# Patient Record
Sex: Female | Born: 1985 | Race: Black or African American | Hispanic: No | Marital: Single | Smoking: Current every day smoker
Health system: Southern US, Community
[De-identification: ages and names within clinical notes are randomized; demographics above are authoritative.]

## PROBLEM LIST (undated history)

## (undated) DIAGNOSIS — L0591 Pilonidal cyst without abscess: Secondary | ICD-10-CM

## (undated) HISTORY — PX: WISDOM TOOTH EXTRACTION: SHX21

---
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
## 2005-06-28 ENCOUNTER — Emergency Department (HOSPITAL_COMMUNITY): Admission: EM | Admit: 2005-06-28 | Discharge: 2005-06-28 | Payer: Self-pay | Admitting: Emergency Medicine

## 2011-08-16 ENCOUNTER — Encounter (HOSPITAL_COMMUNITY): Payer: Self-pay

## 2011-08-16 ENCOUNTER — Emergency Department (HOSPITAL_COMMUNITY)
Admission: EM | Admit: 2011-08-16 | Discharge: 2011-08-16 | Disposition: A | Discharge status: Home / Self Care | Payer: BC Managed Care – PPO | Attending: Emergency Medicine | Admitting: Emergency Medicine

## 2011-08-16 DIAGNOSIS — L0501 Pilonidal cyst with abscess: Secondary | ICD-10-CM | POA: Insufficient documentation

## 2011-08-16 DIAGNOSIS — IMO0001 Reserved for inherently not codable concepts without codable children: Secondary | ICD-10-CM | POA: Insufficient documentation

## 2011-08-16 DIAGNOSIS — R6883 Chills (without fever): Secondary | ICD-10-CM | POA: Insufficient documentation

## 2011-08-16 DIAGNOSIS — F17200 Nicotine dependence, unspecified, uncomplicated: Secondary | ICD-10-CM | POA: Insufficient documentation

## 2011-08-16 DIAGNOSIS — R61 Generalized hyperhidrosis: Secondary | ICD-10-CM | POA: Insufficient documentation

## 2011-08-16 LAB — POCT I-STAT, CHEM 8
Creatinine, Ser: 0.8 mg/dL (ref 0.50–1.10)
Hemoglobin: 14.6 g/dL (ref 12.0–15.0)
Potassium: 3.7 mEq/L (ref 3.5–5.1)
Sodium: 141 mEq/L (ref 135–145)

## 2011-08-16 LAB — DIFFERENTIAL
Basophils Absolute: 0 10*3/uL (ref 0.0–0.1)
Eosinophils Absolute: 0.3 10*3/uL (ref 0.0–0.7)
Lymphocytes Relative: 26 % (ref 12–46)
Lymphs Abs: 2.5 10*3/uL (ref 0.7–4.0)
Neutrophils Relative %: 63 % (ref 43–77)

## 2011-08-16 LAB — CBC
HCT: 38.7 % (ref 36.0–46.0)
Hemoglobin: 13.3 g/dL (ref 12.0–15.0)
MCH: 29.6 pg (ref 26.0–34.0)
MCHC: 34.4 g/dL (ref 30.0–36.0)
MCV: 86.2 fL (ref 78.0–100.0)
Platelets: 273 10*3/uL (ref 150–400)
RBC: 4.49 MIL/uL (ref 3.87–5.11)
RDW: 13.5 % (ref 11.5–15.5)
WBC: 9.8 10*3/uL (ref 4.0–10.5)

## 2011-08-16 MED ORDER — HYDROCODONE-ACETAMINOPHEN 5-325 MG PO TABS: 1.0000 | ORAL_TABLET | Freq: Four times a day (QID) | ORAL | Status: AC | PRN

## 2011-08-16 NOTE — Discharge Instructions (Signed)
Pilonidal Cyst A pilonidal cyst occurs when hairs get trapped (ingrown) beneath the skin in the crease between the buttocks over your sacrum (the bone under that crease). Pilonidal cysts are most common in young men with a lot of body hair. When the cyst is ruptured (breaks) or leaking, fluid from the cyst may cause burning and itching. If the cyst becomes infected, it causes a painful swelling filled with pus (abscess). The pus and trapped hairs need to be removed (often by lancing) so that the infection can heal. However, recurrence is common and an operation may be needed to remove the cyst. HOME CARE INSTRUCTIONS   If the cyst was NOT INFECTED:   Keep the area clean and dry. Bathe or shower daily. Wash the area well with a germ-killing soap. Warm tub baths may help prevent infection and help with drainage. Dry the area well with a towel.   Avoid tight clothing to keep area as moisture free as possible.   Keep area between buttocks as free of hair as possible. A depilatory may be used.   If the cyst WAS INFECTED and needed to be drained:   Your caregiver packed the wound with gauze to keep the wound open. This allows the wound to heal from the inside outwards and continue draining.   Return for a wound check in 1 day or as suggested.   If you take tub baths or showers, repack the wound with gauze following them. Sponge baths (at the sink) are a good alternative.   If an antibiotic was ordered to fight the infection, take as directed.   Only take over-the-counter or prescription medicines for pain, discomfort, or fever as directed by your caregiver.   After the drain is removed, use sitz baths for 20 minutes 4 times per day. Clean the wound gently with mild unscented soap, pat dry, and then apply a dry dressing.  SEEK MEDICAL CARE IF:   You have increased pain, swelling, redness, drainage, or bleeding from the area.   You have a fever.   You have muscles aches, dizziness, or a  general ill feeling.  Document Released: 05/18/2000 Document Revised: 05/10/2011 Document Reviewed: 07/16/2008 [facility] Patient Information 2012 [place], [place]  .Pilonidal Cyst Care After A pilonidal cyst occurs when hairs get trapped (ingrown) beneath the skin in the crease between the buttocks over your sacrum (the bone under that crease). Pilonidal cysts are most common in young men with a lot of body hair. When the cyst breaks(ruptured) or leaks, fluid from the cyst may cause burning and itching. If the cyst becomes infected, it causes a painful swelling filled with pus (abscess). The pus and trapped hairs need to be removed (often by lancing) so that the infection can heal. The word pilonidal means hair nest. HOME CARE INSTRUCTIONS If the pilonidal sinus was NOT DRAINING OR LANCED:  Keep the area clean and dry. Bathe or shower daily. Wash the area well with a germ-killing soap. Hot tub baths may help prevent infection. Dry the area well with a towel.   Avoid tight clothing in order to keep area as moisture-free as possible.   Keep area between buttocks as free from hair as possible. A depilatory may be used.   Take antibiotics as directed.   Only take over-the-counter or prescription medicines for pain, discomfort, or fever as directed by your caregiver.  If the cyst WAS INFECTED AND NEEDED TO BE DRAINED:  Your caregiver may have packed the wound with gauze to  keep the wound open. This allows the wound to heal from the inside outward and continue to drain.   Return as directed for a wound check.   If you take tub baths or showers, repack the wound with gauze as directed following. Sponge baths are a good alternative. Sitz baths may be used three to four times a day or as directed.   If an antibiotic was ordered to fight the infection, take as directed.   Only take over-the-counter or prescription medicines for pain, discomfort, or fever as directed by your caregiver.   If a drain  was in place and removed, use sitz baths for 20 minutes 4 times per day. Clean the wound gently with mild unscented soap, pat dry, and then apply a dry dressing as directed.  If you had surgery and IT WAS MARSUPIALIZED (LEFT OPEN):  Your wound was packed with gauze to keep the wound open. This allows the wound to heal from the inside outwards and continue draining. The changing of the dressing regularly also helps keep the wound clean.   Return as directed for a wound check.   If you take tub baths or showers, repack the wound with gauze as directed following. Sponge baths are a good alternative. Sitz baths can also be used. This may be done three to four times a day or as directed.   If an antibiotic was ordered to fight the infection, take as directed.   Only take over-the-counter or prescription medicines for pain, discomfort, or fever as directed by your caregiver.   If you had surgery and the wound was closed you may care for it as directed. This generally includes keeping it dry and clean and dressing it as directed.  SEEK MEDICAL CARE IF:   You have increased pain, swelling, redness, drainage, or bleeding from the area.   You have a fever.   You have muscles aches, dizziness, or a general ill feeling.  Document Released: 06/21/2006 Document Revised: 05/10/2011 Document Reviewed: 09/05/2006 [facility] Patient Information 2012 [place], [place].

## 2011-08-16 NOTE — ED Provider Notes (Signed)
History     CSN: [id number]  Arrival date & time 08/16/11  1458   First MD Initiated Contact with Patient 08/16/11 1647      Chief Complaint  Patient presents with  . Abscess    to buttocks     (Consider location/radiation/quality/duration/timing/severity/associated sxs/prior treatment) HPI Comments: Pillonidal abscess x 4 days. Positive for night sweats and chills, fever status unknown. Increased pain with BM. No blood in stool. Pt denies IV drug use,DM, HIV, and steroid use.    Patient is a 26 y.o. female presenting with abscess. The history is provided by the patient.  Abscess  This is a new problem. Episode onset: 4 days  The problem has been gradually worsening. Affected Location: pilonidal, extending to left  The problem is moderate. The abscess is characterized by painfulness, redness and swelling. Pertinent negatives include no anorexia, no decrease in physical activity, not sleeping less, not drinking less, no fever, no fussiness, not sleeping more, no diarrhea, no vomiting, no congestion, no rhinorrhea, no sore throat, no decreased responsiveness and no cough.    History reviewed. No pertinent past medical history.  History reviewed. No pertinent past surgical history.  No family history on file.  History  Substance Use Topics  . Smoking status: Current Everyday Smoker  . Smokeless tobacco: Not on file  . Alcohol Use: No    OB History    Grav Para Term Preterm Abortions TAB SAB Ect Mult Living                  Review of Systems  Constitutional: Negative for fever and decreased responsiveness.  HENT: Negative for congestion, sore throat and rhinorrhea.   Respiratory: Negative for cough.   Gastrointestinal: Negative for vomiting, diarrhea and anorexia.  Skin:       abscess    Allergies  Amoxicillin  Home Medications   Current Outpatient Rx  Name Route Sig Dispense Refill  . EYE WASH OP SOLN Both Eyes Place 1 drop into both eyes 2 (two) times daily.  For 3 days.    Marland Kitchen RANITIDINE HCL 75 MG PO TABS Oral Take 150 mg by mouth once.      BP 129/77  Pulse 86  Temp(Src) 99.4 F (37.4 C) (Oral)  Resp 16  Wt 156 lb (70.761 kg)  SpO2 100%  LMP 08/03/2011  Physical Exam  Nursing note and vitals reviewed. Constitutional: She is oriented to person, place, and time. She appears well-developed and well-nourished. She does not have a sickly appearance. She does not appear ill. No distress.  HENT:  Head: Normocephalic and atraumatic.  Eyes: Conjunctivae and EOM are normal.  Neck: Normal range of motion. Neck supple.  Cardiovascular: Normal rate and regular rhythm.   Pulmonary/Chest: Effort normal and breath sounds normal.  Genitourinary:       Chaperone was present. Patient with no pain around the rectal area. There are no external fissures noted. No induration of the skin or swelling. No external hemorrhoids seen. Patient able to tolerate examination. I was able to feel the first 2-3cm of the rectum digitally without gross abnormality. There is no gross blood.  NO signs of perirectal abscess.    Musculoskeletal: She exhibits no edema.  Lymphadenopathy:       Head (right side): No submental, no preauricular and no posterior auricular adenopathy present.       Head (left side): No submental, no submandibular, no preauricular and no posterior auricular adenopathy present.    She has no  axillary adenopathy.  Neurological: She is alert and oriented to person, place, and time.  Skin: Skin is warm and dry. No rash noted. She is not diaphoretic.       3cm x 2cm sized pilonidal abscess with no rectal involvement.  Extreme tenderness to palpation. Not currently draining. Abscess is fluctuant without warmth, surrounding erythema, or induration.    ED Course  Procedures (including critical care time)   Labs Reviewed  CBC  DIFFERENTIAL   No results found.   No diagnosis found.  INCISION AND DRAINAGE Performed by: Jaci Carrel Consent: Verbal  consent obtained. Risks and benefits: risks, benefits and alternatives were discussed Type: abscess  Body area: pilonidol  Anesthesia: local infiltration  Local anesthetic: lidocaine 2% w epinephrine  Anesthetic total: 2 ml  Complexity: complex Blunt dissection to break up loculations  Drainage: purulent  Drainage amount: Large   Packing material: 1/4 in iodoform gauze  Patient tolerance: Patient tolerated the procedure well with no immediate complications.     MDM  Pilonidol abscess  Patient with skin abscess amenable to incision and drainage.  Abscess was large enough to warrant packing with removal and wound recheck in 2 days. No signs of cellulitis is surrounding skin.  Will d/c to home.  No antibiotic therapy is indicated.         Jaci Carrel, [place] 08/16/11 7829

## 2011-08-16 NOTE — ED Notes (Signed)
Pt c/o abscess to buttock area x4 days, states unable to sit.

## 2011-08-17 NOTE — ED Provider Notes (Signed)
Medical screening examination/treatment/procedure(s) were performed by non-physician practitioner and as supervising physician I was immediately available for consultation/collaboration.   Patrizia Paule M Alexismarie Flaim, MD 08/17/11 2312 

## 2011-08-20 ENCOUNTER — Encounter (HOSPITAL_COMMUNITY): Payer: Self-pay

## 2011-08-20 ENCOUNTER — Emergency Department (HOSPITAL_COMMUNITY)
Admission: EM | Admit: 2011-08-20 | Discharge: 2011-08-20 | Disposition: A | Discharge status: Home / Self Care | Payer: BC Managed Care – PPO | Source: Home / Self Care | Attending: Family Medicine | Admitting: Family Medicine

## 2011-08-20 DIAGNOSIS — L0501 Pilonidal cyst with abscess: Secondary | ICD-10-CM

## 2011-08-20 NOTE — ED Provider Notes (Signed)
History     CSN: [id number]  Arrival date & time 08/20/11  1603   First MD Initiated Contact with Patient 08/20/11 1653      Chief Complaint  Patient presents with  . Follow-up    (Consider location/radiation/quality/duration/timing/severity/associated sxs/prior treatment) Patient is a 26 y.o. female presenting with wound check. The history is provided by the patient.  Wound Check  She was treated in the ED 2 to 3 days ago. Previous treatment in the ED includes I&D of abscess. There has been no treatment since the wound repair. There has been no drainage from the wound. There is no redness present. There is no swelling present. The pain has no pain. She has no difficulty moving the affected extremity or digit.    History reviewed. No pertinent past medical history.  History reviewed. No pertinent past surgical history.  No family history on file.  History  Substance Use Topics  . Smoking status: Current Everyday Smoker  . Smokeless tobacco: Not on file  . Alcohol Use: No    OB History    Grav Para Term Preterm Abortions TAB SAB Ect Mult Living                  Review of Systems  Constitutional: Negative.   Skin: Positive for wound.    Allergies  Amoxicillin  Home Medications   Current Outpatient Rx  Name Route Sig Dispense Refill  . HYDROCODONE-ACETAMINOPHEN 5-325 MG PO TABS Oral Take 1 tablet by mouth every 6 (six) hours as needed for pain. 15 tablet 0  . EYE WASH OP SOLN Both Eyes Place 1 drop into both eyes 2 (two) times daily. For 3 days.    Marland Kitchen RANITIDINE HCL 75 MG PO TABS Oral Take 150 mg by mouth once.      BP 139/89  Pulse 69  Temp(Src) 99.2 F (37.3 C) (Oral)  Resp 16  SpO2 100%  LMP 07/29/2011  Physical Exam  Nursing note and vitals reviewed. Constitutional: She appears well-developed and well-nourished.  Skin: Skin is warm and dry.       Left gluteal packing removed, min purulent fluid expressed. dsd.    ED Course  Procedures (including  critical care time)  Labs Reviewed - No data to display No results found.   1. Pilonidal abscess       MDM  Packing removed.        Linna Hoff, MD 08/20/11 [id number]

## 2011-08-20 NOTE — ED Notes (Signed)
Pt seen in ED on 08/16/11 for abscess to buttocks.  Here today for recheck and packing removal.  Denies concerns.  States she has not needed pain medication.

## 2011-08-20 NOTE — Discharge Instructions (Signed)
continue local warm soaks  as needed.return if any further problems.

## 2011-12-17 ENCOUNTER — Encounter (HOSPITAL_COMMUNITY): Payer: Self-pay

## 2011-12-17 ENCOUNTER — Emergency Department (HOSPITAL_COMMUNITY)
Admission: EM | Admit: 2011-12-17 | Discharge: 2011-12-17 | Disposition: A | Discharge status: Home / Self Care | Payer: BC Managed Care – PPO | Source: Home / Self Care | Attending: Family Medicine | Admitting: Family Medicine

## 2011-12-17 DIAGNOSIS — S01312A Laceration without foreign body of left ear, initial encounter: Secondary | ICD-10-CM

## 2011-12-17 DIAGNOSIS — S01309A Unspecified open wound of unspecified ear, initial encounter: Secondary | ICD-10-CM

## 2011-12-17 MED ORDER — SULFAMETHOXAZOLE-TRIMETHOPRIM 800-160 MG PO TABS: 1.0000 | ORAL_TABLET | Freq: Two times a day (BID) | ORAL | Status: DC

## 2011-12-17 MED ORDER — TETANUS-DIPHTH-ACELL PERTUSSIS 5-2.5-18.5 LF-MCG/0.5 IM SUSP
INTRAMUSCULAR | Status: AC
  Filled 2011-12-17: qty 0.5

## 2011-12-17 MED ORDER — SULFAMETHOXAZOLE-TRIMETHOPRIM 800-160 MG PO TABS: 1.0000 | ORAL_TABLET | Freq: Two times a day (BID) | ORAL | Status: AC

## 2011-12-17 MED ORDER — HYDROCODONE-ACETAMINOPHEN 5-325 MG PO TABS: 2.0000 | ORAL_TABLET | ORAL | Status: AC | PRN

## 2011-12-17 MED ORDER — TETANUS-DIPHTH-ACELL PERTUSSIS 5-2.5-18.5 LF-MCG/0.5 IM SUSP
0.5000 mL | Freq: Once | INTRAMUSCULAR | Status: AC
  Administered 2011-12-17: 0.5 mL via INTRAMUSCULAR

## 2011-12-17 NOTE — ED Notes (Signed)
States she lost her footing last night when she stepped on a branch, and fell into a bush, where she landed on a sharp stick and that cut her left ear. Denies other injury ; bleeding controlled

## 2011-12-17 NOTE — ED Provider Notes (Signed)
Medical screening examination/treatment/procedure(s) were performed by non-physician practitioner and as supervising physician I was immediately available for consultation/collaboration.   [facility]; MD   Sharin Grave, MD 12/17/11 1734

## 2011-12-17 NOTE — ED Provider Notes (Signed)
History     CSN: [id number]  Arrival date & time 12/17/11  1203   First MD Initiated Contact with Patient 12/17/11 1421      Chief Complaint  Patient presents with  . Ear Laceration    (Consider location/radiation/quality/duration/timing/severity/associated sxs/prior treatment) Patient is a 26 y.o. female presenting with skin laceration. The history is provided by the patient. No language interpreter was used.  Laceration  The incident occurred 6 to 12 hours ago. Pain location: ear l. The laceration is 1 cm in size. Injury mechanism: a bush. The pain is at a severity of 4/10. The pain is moderate. Possible foreign bodies include wood. Her tetanus status is unknown.  Pt fell last pm and hit her left ear.    History reviewed. No pertinent past medical history.  History reviewed. No pertinent past surgical history.  History reviewed. No pertinent family history.  History  Substance Use Topics  . Smoking status: Current Everyday Smoker  . Smokeless tobacco: Not on file  . Alcohol Use: Yes    OB History    Grav Para Term Preterm Abortions TAB SAB Ect Mult Living                  Review of Systems  HENT: Positive for ear pain.   All other systems reviewed and are negative.    Allergies  Amoxicillin  Home Medications   Current Outpatient Rx  Name Route Sig Dispense Refill  . EYE WASH OP SOLN Both Eyes Place 1 drop into both eyes 2 (two) times daily. For 3 days.    Marland Kitchen RANITIDINE HCL 75 MG PO TABS Oral Take 150 mg by mouth once.      BP 159/96  Pulse 84  Temp 98.6 F (37 C) (Oral)  Resp 18  SpO2 100%  LMP 12/09/2011  Physical Exam  Constitutional: She is oriented to person, place, and time. She appears well-developed and well-nourished.  HENT:       1 cm laceration left ear  Neurological: She is alert and oriented to person, place, and time.  Skin: Skin is warm.  Psychiatric: She has a normal mood and affect.    ED Course  LACERATION REPAIR Date/Time:  12/17/2011 2:49 PM Performed by: Elson Areas Authorized by: Sharin Grave Consent: Verbal consent not obtained. Consent given by: patient Patient understanding: patient does not state understanding of the procedure being performed Patient consent: the patient's understanding of the procedure does not match consent given Body area: head/neck Location details: left ear Laceration length: 1 cm Foreign bodies: wood Tendon involvement: none Nerve involvement: none Anesthesia: local infiltration Local anesthetic: lidocaine 2% without epinephrine Preparation: Patient was prepped and draped in the usual sterile fashion. Irrigation solution: saline Amount of cleaning: standard Debridement: none Degree of undermining: none Skin closure: 6-0 Prolene Number of sutures: 6 Technique: simple Approximation difficulty: complex Comments: Laceration thru ear lobe, cartilage not damaged   (including critical care time)  Labs Reviewed - No data to display No results found.   1. Laceration of ear, external, left, complicated       MDM  Suture removal in 8 days        Elson Areas, [place] 12/17/11 1452  Lonia Skinner [place], [place] 12/17/11 1453

## 2011-12-25 ENCOUNTER — Emergency Department (INDEPENDENT_AMBULATORY_CARE_PROVIDER_SITE_OTHER)
Admission: EM | Admit: 2011-12-25 | Discharge: 2011-12-25 | Disposition: A | Discharge status: Home / Self Care | Payer: BC Managed Care – PPO | Source: Home / Self Care

## 2011-12-25 ENCOUNTER — Encounter (HOSPITAL_COMMUNITY): Payer: Self-pay

## 2011-12-25 DIAGNOSIS — Z4802 Encounter for removal of sutures: Secondary | ICD-10-CM

## 2011-12-25 NOTE — ED Notes (Signed)
Here for wound check and suture removal; states she feels better

## 2011-12-25 NOTE — ED Provider Notes (Signed)
History     CSN: [id number]  Arrival date & time 12/25/11  1452   None     Chief Complaint  Patient presents with  . Wound Check    (Consider location/radiation/quality/duration/timing/severity/associated sxs/prior treatment) Patient is a 26 y.o. female presenting with wound check. The history is provided by the patient.  Wound Check  She was treated in the ED 10 to 14 days ago. Previous treatment in the ED includes laceration repair. There has been no treatment since the wound repair. Her temperature was unmeasured prior to arrival. There has been no drainage from the wound. There is no redness present. There is no swelling present. The pain has no pain.    History reviewed. No pertinent past medical history.  History reviewed. No pertinent past surgical history.  History reviewed. No pertinent family history.  History  Substance Use Topics  . Smoking status: Current Everyday Smoker  . Smokeless tobacco: Not on file  . Alcohol Use: Yes    OB History    Grav Para Term Preterm Abortions TAB SAB Ect Mult Living                  Review of Systems  Constitutional: Negative for fever and chills.  Skin:       Wound healing    Allergies  Amoxicillin  Home Medications   Current Outpatient Rx  Name Route Sig Dispense Refill  . HYDROCODONE-ACETAMINOPHEN 5-325 MG PO TABS Oral Take 2 tablets by mouth every 4 (four) hours as needed for pain. 10 tablet 0  . EYE WASH OP SOLN Both Eyes Place 1 drop into both eyes 2 (two) times daily. For 3 days.    Marland Kitchen RANITIDINE HCL 75 MG PO TABS Oral Take 150 mg by mouth once.    . SULFAMETHOXAZOLE-TRIMETHOPRIM 800-160 MG PO TABS Oral Take 1 tablet by mouth every 12 (twelve) hours. 14 tablet 0    BP 130/78  Pulse 79  Temp 99.1 F (37.3 C) (Oral)  Resp 18  SpO2 98%  LMP 12/09/2011  Physical Exam  Constitutional: She appears well-developed and well-nourished. No distress.  Skin: Skin is warm and dry.       6 sutures visible in L  ear, wound appears well healed.     ED Course  SUTURE REMOVAL Date/Time: 12/25/2011 4:30 PM Performed by: Cathlyn Parsons Authorized by: Cathlyn Parsons Consent: Verbal consent obtained. Written consent not obtained. Consent given by: patient Patient identity confirmed: verbally with patient Body area: head/neck Location details: left ear Wound Appearance: clean Sutures Removed: 6 Facility: sutures placed in this facility Patient tolerance: Patient tolerated the procedure well with no immediate complications.   (including critical care time)  Labs Reviewed - No data to display No results found.   1. Visit for suture removal       MDM          Cathlyn Parsons, NP 12/25/11 2103

## 2011-12-26 NOTE — ED Provider Notes (Signed)
Medical screening examination/treatment/procedure(s) were performed by non-physician practitioner and as supervising physician I was immediately available for consultation/collaboration.  Leslee Home, M.D.   Reuben Likes, MD 12/26/11 2045

## 2012-09-17 ENCOUNTER — Emergency Department (HOSPITAL_COMMUNITY)
Admission: EM | Admit: 2012-09-17 | Discharge: 2012-09-17 | Disposition: A | Discharge status: Home / Self Care | Payer: BC Managed Care – PPO | Attending: Emergency Medicine | Admitting: Emergency Medicine

## 2012-09-17 ENCOUNTER — Encounter (HOSPITAL_COMMUNITY): Payer: Self-pay

## 2012-09-17 DIAGNOSIS — R591 Generalized enlarged lymph nodes: Secondary | ICD-10-CM

## 2012-09-17 DIAGNOSIS — R599 Enlarged lymph nodes, unspecified: Secondary | ICD-10-CM | POA: Insufficient documentation

## 2012-09-17 DIAGNOSIS — L0591 Pilonidal cyst without abscess: Secondary | ICD-10-CM | POA: Insufficient documentation

## 2012-09-17 DIAGNOSIS — Z79899 Other long term (current) drug therapy: Secondary | ICD-10-CM | POA: Insufficient documentation

## 2012-09-17 DIAGNOSIS — F17200 Nicotine dependence, unspecified, uncomplicated: Secondary | ICD-10-CM | POA: Insufficient documentation

## 2012-09-17 HISTORY — DX: Pilonidal cyst without abscess: L05.91

## 2012-09-17 MED ORDER — CEPHALEXIN 500 MG PO CAPS: 500.0000 mg | ORAL_CAPSULE | Freq: Four times a day (QID) | ORAL | Status: DC

## 2012-09-17 NOTE — ED Notes (Signed)
Pt c/o tender "bump" on L groin x 1 day.  Sts Hx of cyst on sacrum.  Denies pain, except with palpitation.  Denies discharge.

## 2012-09-17 NOTE — ED Provider Notes (Signed)
History     CSN: [id number]  Arrival date & time 09/17/12  4098   First MD Initiated Contact with Patient 09/17/12 0935      Chief Complaint  Patient presents with  . Groin Pain    (Consider location/radiation/quality/duration/timing/severity/associated sxs/prior treatment) HPI.... mass in left inguinal area for 24 hours. Patient also complains of inflamed pilonidal cyst. No vaginal bleeding, vaginal discharge, fever, chills, dysuria. Nothing makes symptoms better or worse. Severity is mild. No radiation of pain  No past medical history on file.  No past surgical history on file.  No family history on file.  History  Substance Use Topics  . Smoking status: Current Every Day Smoker  . Smokeless tobacco: Not on file  . Alcohol Use: Yes    OB History   Grav Para Term Preterm Abortions TAB SAB Ect Mult Living                  Review of Systems  All other systems reviewed and are negative.    Allergies  Amoxicillin  Home Medications   Current Outpatient Rx  Name  Route  Sig  Dispense  Refill  . cephALEXin (KEFLEX) 500 MG capsule   Oral   Take 1 capsule (500 mg total) by mouth 4 (four) times daily.   40 capsule   0   . Ophthalmic Irrigation Solution (EYE WASH) SOLN   Both Eyes   Place 1 drop into both eyes 2 (two) times daily. For 3 days.         . ranitidine (ZANTAC) 75 MG tablet   Oral   Take 150 mg by mouth once.           BP 139/84  Pulse 103  Temp(Src) 98.7 F (37.1 C)  Resp 18  SpO2 100%  Physical Exam  Nursing note and vitals reviewed. Constitutional: She is oriented to person, place, and time. She appears well-developed and well-nourished.  HENT:  Head: Normocephalic and atraumatic.  Eyes: Conjunctivae and EOM are normal. Pupils are equal, round, and reactive to light.  Neck: Normal range of motion. Neck supple.  Cardiovascular: Normal rate, regular rhythm and normal heart sounds.   Pulmonary/Chest: Effort normal and breath sounds  normal.  Abdominal: Soft. Bowel sounds are normal.  Musculoskeletal: Normal range of motion.  Neurological: She is alert and oriented to person, place, and time.  Skin:  Lower back: Slightly indurated pilonidal cyst approximately 1.5 cm in diameter.  No obvious abscess.    Left groin:   1 cm glandular structure in crease of inguinal area  Psychiatric: She has a normal mood and affect.    ED Course  Procedures (including critical care time)  Labs Reviewed - No data to display No results found.   1. Pilonidal cyst   2. Adenopathy       MDM  Patient has known pilonidal cyst. Is not ready at this point for an incision and drainage. Rx Keflex 500 mg 4 times a day #40. Patient is potentially pregnant. Also noted was adenopathy in left inguinal area.  Patient is nontoxic.        [name], MD 09/17/12 1011

## 2012-09-17 NOTE — ED Notes (Addendum)
Pt escorted to discharge window. Verbalized understanding discharge instructions. In no acute distress. Vitals reviewed and WDL.  

## 2012-09-20 ENCOUNTER — Emergency Department (HOSPITAL_COMMUNITY)
Admission: EM | Admit: 2012-09-20 | Discharge: 2012-09-20 | Disposition: A | Discharge status: Home / Self Care | Payer: BC Managed Care – PPO | Attending: Emergency Medicine | Admitting: Emergency Medicine

## 2012-09-20 ENCOUNTER — Encounter (HOSPITAL_COMMUNITY): Payer: Self-pay | Admitting: Emergency Medicine

## 2012-09-20 DIAGNOSIS — Z87891 Personal history of nicotine dependence: Secondary | ICD-10-CM | POA: Insufficient documentation

## 2012-09-20 DIAGNOSIS — R509 Fever, unspecified: Secondary | ICD-10-CM | POA: Insufficient documentation

## 2012-09-20 DIAGNOSIS — L0501 Pilonidal cyst with abscess: Secondary | ICD-10-CM | POA: Insufficient documentation

## 2012-09-20 LAB — GLUCOSE, CAPILLARY: Glucose-Capillary: 103 mg/dL — ABNORMAL HIGH (ref 70–99)

## 2012-09-20 MED ORDER — SODIUM CHLORIDE 0.9 % IV BOLUS (SEPSIS)
1000.0000 mL | Freq: Once | INTRAVENOUS | Status: AC
  Administered 2012-09-20: 1000 mL via INTRAVENOUS

## 2012-09-20 MED ORDER — HYDROMORPHONE HCL PF 1 MG/ML IJ SOLN
1.0000 mg | Freq: Once | INTRAMUSCULAR | Status: AC
  Administered 2012-09-20: 1 mg via INTRAVENOUS
  Filled 2012-09-20: qty 1

## 2012-09-20 MED ORDER — ONDANSETRON HCL 4 MG/2ML IJ SOLN
4.0000 mg | Freq: Once | INTRAMUSCULAR | Status: AC
  Administered 2012-09-20: 4 mg via INTRAVENOUS
  Filled 2012-09-20: qty 2

## 2012-09-20 NOTE — ED Notes (Signed)
Pt alert, arrives from home, c/o ? Wound to coccyx, seen several days ago, treated and sent home, pt states area open, painful, resp even unlabored, skin pwd

## 2012-09-20 NOTE — ED Notes (Signed)
MD at bedside. 

## 2012-09-20 NOTE — ED Provider Notes (Signed)
History     CSN: [id number]  Arrival date & time 09/20/12  4098   First MD Initiated Contact with Patient 09/20/12 0320      Chief Complaint  Patient presents with  . Wound Check    (Consider location/radiation/quality/duration/timing/severity/associated sxs/prior treatment) HPI... patient seen on 09/17/2012 for inflamed pilonidal cyst.  No incision and drainage was performed at that time. Patient returns tonight with a worsening abscess in the same area. Low grade fever.  Palpation makes symptoms worse. Severity is moderate. No radiation of pain  Past Medical History  Diagnosis Date  . Cyst near tailbone   . Pilonidal cyst     Past Surgical History  Procedure Laterality Date  . Wisdom tooth extraction      No family history on file.  History  Substance Use Topics  . Smoking status: Former Smoker    Quit date: 09/15/2012  . Smokeless tobacco: Not on file  . Alcohol Use: No    OB History   Grav Para Term Preterm Abortions TAB SAB Ect Mult Living                  Review of Systems  All other systems reviewed and are negative.    Allergies  Amoxicillin  Home Medications   Current Outpatient Rx  Name  Route  Sig  Dispense  Refill  . cephALEXin (KEFLEX) 500 MG capsule   Oral   Take 1 capsule (500 mg total) by mouth 4 (four) times daily.   40 capsule   0     BP 162/81  Pulse 116  Temp(Src) 100.1 F (37.8 C) (Oral)  Resp 16  Ht 5\' 4"  (1.626 m)  Wt 165 lb (74.844 kg)  BMI 28.31 kg/m2  SpO2 99%  LMP 08/13/2012  Physical Exam  Nursing note and vitals reviewed. Constitutional: She is oriented to person, place, and time. She appears well-developed and well-nourished.  HENT:  Head: Normocephalic and atraumatic.  Eyes: Conjunctivae and EOM are normal. Pupils are equal, round, and reactive to light.  Neck: Normal range of motion. Neck supple.  Cardiovascular: Normal rate, regular rhythm and normal heart sounds.   Pulmonary/Chest: Effort normal and  breath sounds normal.  Abdominal: Soft. Bowel sounds are normal.  Musculoskeletal: Normal range of motion.  Neurological: She is alert and oriented to person, place, and time.  Skin:  Obvious abscess in lower back consistent with a infected pilonidal cyst  Psychiatric: She has a normal mood and affect.    ED Course  INCISION AND DRAINAGE Date/Time: 09/20/2012 4:42 AM Performed by: [name] Authorized by: [name] Consent: Verbal consent obtained. Risks and benefits: risks, benefits and alternatives were discussed Consent given by: patient Patient understanding: patient states understanding of the procedure being performed Relevant documents: relevant documents present and verified Site marked: the operative site was marked Patient identity confirmed: verbally with patient Time out: Immediately prior to procedure a "time out" was called to verify the correct patient, procedure, equipment, support staff and site/side marked as required. Type: abscess Location: Lower back. Anesthesia: local infiltration Local anesthetic: lidocaine 1% without epinephrine Anesthetic total: 8 ml Patient sedated: no Scalpel size: 11 Incision type: single straight Drainage: purulent Drainage amount: copious Packing material: 1/4 in iodoform gauze Patient tolerance: Patient tolerated the procedure well with no immediate complications.   (including critical care time)  Labs Reviewed - No data to display No results found.   1. Pilonidal cyst with abscess  MDM  Incision and drainage of pilonidal cyst with abscess.  Copious amount of pus expressed. Iodoform gauze packing. Patient will return Monday for a recheck        [name], MD 09/20/12 [phone]

## 2012-09-22 ENCOUNTER — Emergency Department (HOSPITAL_COMMUNITY)
Admission: EM | Admit: 2012-09-22 | Discharge: 2012-09-22 | Disposition: A | Discharge status: Home / Self Care | Payer: BC Managed Care – PPO | Attending: Emergency Medicine | Admitting: Emergency Medicine

## 2012-09-22 ENCOUNTER — Encounter (HOSPITAL_COMMUNITY): Payer: Self-pay | Admitting: Emergency Medicine

## 2012-09-22 DIAGNOSIS — Z872 Personal history of diseases of the skin and subcutaneous tissue: Secondary | ICD-10-CM | POA: Insufficient documentation

## 2012-09-22 DIAGNOSIS — Z5189 Encounter for other specified aftercare: Secondary | ICD-10-CM

## 2012-09-22 DIAGNOSIS — Z4801 Encounter for change or removal of surgical wound dressing: Secondary | ICD-10-CM | POA: Insufficient documentation

## 2012-09-22 DIAGNOSIS — Z87891 Personal history of nicotine dependence: Secondary | ICD-10-CM | POA: Insufficient documentation

## 2012-09-22 NOTE — ED Notes (Signed)
Pt was seen here for cyst that was I and D and is here for a re-check. Minimal yellow drainage noted to dressing. Pt denies pain. But reports itching.

## 2012-09-22 NOTE — ED Notes (Signed)
MD at bedside. 

## 2012-09-22 NOTE — ED Provider Notes (Signed)
History     CSN: [id number]  Arrival date & time 09/22/12  1112   First MD Initiated Contact with Patient 09/22/12 1122      Chief Complaint  Patient presents with  . Wound Check    (Consider location/radiation/quality/duration/timing/severity/associated sxs/prior treatment) HPI Comments: Pt with hx of pilonidal cyst, s/p drainage and packing 2 days ago comes to the ED for wound check. Pt states that she feels a lot better post procedure, but continues to have purulent discharge from the wound site. No n/v/f/c.  Patient is a 27 y.o. female presenting with wound check. The history is provided by the patient and medical records.  Wound Check Pertinent negatives include no chest pain, no abdominal pain, no headaches and no shortness of breath.    Past Medical History  Diagnosis Date  . Cyst near tailbone   . Pilonidal cyst     Past Surgical History  Procedure Laterality Date  . Wisdom tooth extraction      No family history on file.  History  Substance Use Topics  . Smoking status: Former Smoker    Quit date: 09/15/2012  . Smokeless tobacco: Not on file  . Alcohol Use: No    OB History   Grav Para Term Preterm Abortions TAB SAB Ect Mult Living                  Review of Systems  Constitutional: Negative for activity change.  HENT: Negative for neck pain.   Respiratory: Negative for shortness of breath.   Cardiovascular: Negative for chest pain.  Gastrointestinal: Negative for nausea, vomiting and abdominal pain.  Genitourinary: Negative for dysuria.  Skin: Positive for wound.  Neurological: Negative for headaches.    Allergies  Amoxicillin  Home Medications   Current Outpatient Rx  Name  Route  Sig  Dispense  Refill  . cephALEXin (KEFLEX) 500 MG capsule   Oral   Take 500 mg by mouth 4 (four) times daily. Day 5 of therapy. 10 day regimen           BP 120/69  Pulse 102  Temp(Src) 97.9 F (36.6 C) (Oral)  Resp 18  SpO2 100%  LMP  08/13/2012  Physical Exam  Nursing note and vitals reviewed. Constitutional: She is oriented to person, place, and time. She appears well-developed and well-nourished.  HENT:  Head: Normocephalic and atraumatic.  Eyes: EOM are normal. Pupils are equal, round, and reactive to light.  Neck: Neck supple.  Cardiovascular: Normal rate, regular rhythm and normal heart sounds.   No murmur heard. Pulmonary/Chest: Effort normal. No respiratory distress.  Abdominal: Soft. She exhibits no distension. There is no tenderness. There is no rebound and no guarding.  Neurological: She is alert and oriented to person, place, and time.  Skin: Skin is warm and dry.  The I&D site appears clean. There is surrounding mild erythema and induration, tender to palpation, the iodoform packing is soaked in purulent discharge, and there is trace purulent discharge on palpation.    ED Course  Wound packing Date/Time: 09/22/2012 12:19 PM Performed by: Derwood Kaplan Authorized by: Derwood Kaplan Consent: Verbal consent obtained. Risks and benefits: risks, benefits and alternatives were discussed Consent given by: patient Patient identity confirmed: verbally with patient Time out: Immediately prior to procedure a "time out" was called to verify the correct patient, procedure, equipment, support staff and site/side marked as required. Preparation: Patient was prepped and draped in the usual sterile fashion. Local anesthesia used: no Patient sedated:  no   (including critical care time)  Labs Reviewed - No data to display No results found.   1. Wound check, abscess       MDM  Pt comes in for wound check after having pilonidal cyst drained. She has a deep fissure - and the gauze is still soaked in purulent discharge - and patient has been having some purulent discharge.  I irrigated the wound again, cleansed it and re-packed it.  I have provided her Surgery f/u - if she is not getting better she will  follow up with them, as i dont think it is advisable for ED to incise any further.  Return to ER for wound check if the area is getting worse and no surgery appt.          Derwood Kaplan, MD 09/22/12 1223

## 2013-09-30 ENCOUNTER — Emergency Department (HOSPITAL_COMMUNITY)
Admission: EM | Admit: 2013-09-30 | Discharge: 2013-09-30 | Disposition: A | Discharge status: Home / Self Care | Payer: BC Managed Care – PPO | Attending: Emergency Medicine | Admitting: Emergency Medicine

## 2013-09-30 ENCOUNTER — Encounter (HOSPITAL_COMMUNITY): Payer: Self-pay | Admitting: Emergency Medicine

## 2013-09-30 DIAGNOSIS — Z792 Long term (current) use of antibiotics: Secondary | ICD-10-CM | POA: Insufficient documentation

## 2013-09-30 DIAGNOSIS — Z880 Allergy status to penicillin: Secondary | ICD-10-CM | POA: Insufficient documentation

## 2013-09-30 DIAGNOSIS — F17200 Nicotine dependence, unspecified, uncomplicated: Secondary | ICD-10-CM | POA: Insufficient documentation

## 2013-09-30 DIAGNOSIS — N764 Abscess of vulva: Secondary | ICD-10-CM | POA: Insufficient documentation

## 2013-09-30 NOTE — Progress Notes (Signed)
  CARE MANAGEMENT ED NOTE 09/30/2013  Patient:  [name],[name]   Account Number:  [id number]  Date Initiated:  09/30/2013  Documentation initiated by:  [name] [name]  Subjective/Objective Assessment:   28 yr old [facility] [place] resident complaining of "what she thought was a cyst", it burst this morning and the fluid was sticky -vaginal area     Subjective/Objective Assessment Detail:   Female supportive at bedside  OB GYN [name] [name]     Action/Plan:   Cm discussed with pt the importance for pcp f/u care  OB GYN entered in EPIC Provided pt with a 6 page list of in network [facility] providers within zip [phone] Discussed in network providers with pt   Action/Plan Detail:   Encouraged to always check web site on insurance card or toll free number for customer services to assist with finding other in network   Anticipated DC Date:  09/30/2013     Status Recommendation to Physician:   Result of Recommendation:    Other ED Services  Consult Working Plan    DC Planning Services  Other  PCP issues  Outpatient Services - Pt will follow up    Choice offered to / List presented to:            Status of service:  Completed, signed off  ED Comments:   ED Comments Detail:

## 2013-09-30 NOTE — Discharge Instructions (Signed)
Apply warm compresses intermittently throughout the day. Follow up with your ob/gyn.  Abscess An abscess is an infected area that contains a collection of pus and debris.It can occur in almost any part of the body. An abscess is also known as a furuncle or boil. CAUSES  An abscess occurs when tissue gets infected. This can occur from blockage of oil or sweat glands, infection of hair follicles, or a minor injury to the skin. As the body tries to fight the infection, pus collects in the area and creates pressure under the skin. This pressure causes pain. People with weakened immune systems have difficulty fighting infections and get certain abscesses more often.  SYMPTOMS Usually an abscess develops on the skin and becomes a painful mass that is red, warm, and tender. If the abscess forms under the skin, you may feel a moveable soft area under the skin. Some abscesses break open (rupture) on their own, but most will continue to get worse without care. The infection can spread deeper into the body and eventually into the bloodstream, causing you to feel ill.  DIAGNOSIS  Your caregiver will take your medical history and perform a physical exam. A sample of fluid may also be taken from the abscess to determine what is causing your infection. TREATMENT  Your caregiver may prescribe antibiotic medicines to fight the infection. However, taking antibiotics alone usually does not cure an abscess. Your caregiver may need to make a small cut (incision) in the abscess to drain the pus. In some cases, gauze is packed into the abscess to reduce pain and to continue draining the area. HOME CARE INSTRUCTIONS   Only take over-the-counter or prescription medicines for pain, discomfort, or fever as directed by your caregiver.  If you were prescribed antibiotics, take them as directed. Finish them even if you start to feel better.  If gauze is used, follow your caregiver's directions for changing the gauze.  To  avoid spreading the infection:  Keep your draining abscess covered with a bandage.  Wash your hands well.  Do not share personal care items, towels, or whirlpools with others.  Avoid skin contact with others.  Keep your skin and clothes clean around the abscess.  Keep all follow-up appointments as directed by your caregiver. SEEK MEDICAL CARE IF:   You have increased pain, swelling, redness, fluid drainage, or bleeding.  You have muscle aches, chills, or a general ill feeling.  You have a fever. MAKE SURE YOU:   Understand these instructions.  Will watch your condition.  Will get help right away if you are not doing well or get worse. Document Released: 02/28/2005 Document Revised: 11/20/2011 Document Reviewed: 08/03/2011 [facility] Patient Information 2014 [place], [place].

## 2013-09-30 NOTE — ED Notes (Signed)
Pt states she thought she had a cyst on her vagina.  Pt states bump has been there for over a year.  Today, bump had puss drainage.

## 2013-09-30 NOTE — ED Provider Notes (Signed)
CSN: [id number]     Arrival date & time 09/30/13  1403 History   This chart was scribed for non-physician practitioner, Johnnette Gourdobyn Albert, working with No att. providers found, by Tana ConchStephen Methvin ED Scribe. This patient was seen in [facility] and the patient's care was started at 2:20 PM.    Chief Complaint  Patient presents with  . Abscess      HPI  HPI Comments: [name] [name] is a 28 y.o. female who presents to the Emergency Department complaining of "what she thought was a cyst" groin area, it burst this morning and she reports that "the fluid was sticky". She states that it did not hurt unless "she touches the meaty part". She denies any fever, chills, and vaginal discharge.   Past Medical History  Diagnosis Date  . Cyst near tailbone   . Pilonidal cyst    Past Surgical History  Procedure Laterality Date  . Wisdom tooth extraction     History reviewed. No pertinent family history. History  Substance Use Topics  . Smoking status: Current Every Day Smoker    Last Attempt to Quit: 09/15/2012  . Smokeless tobacco: Not on file  . Alcohol Use: No   OB History   Grav Para Term Preterm Abortions TAB SAB Ect Mult Living                 Review of Systems  Skin: Positive for wound (abscess on right groin area).  All other systems reviewed and are negative.     Allergies  Amoxicillin  Home Medications   Prior to Admission medications   Medication Sig Start Date End Date Taking? Authorizing Provider  cephALEXin (KEFLEX) 500 MG capsule Take 500 mg by mouth 4 (four) times daily. Day 5 of therapy. 10 day regimen 09/17/12   Donnetta HutchingBrian Cook, MD   BP 145/101  Pulse 81  Temp(Src) 98.4 F (36.9 C) (Oral)  Resp 16  SpO2 100%  LMP 09/29/2013 Physical Exam  Nursing note and vitals reviewed. Constitutional: She is oriented to person, place, and time. She appears well-developed and well-nourished. No distress.  HENT:  Head: Normocephalic and atraumatic.  Right Ear: External ear  normal.  Left Ear: External ear normal.  Nose: Nose normal.  Mouth/Throat: Oropharynx is clear and moist.  Eyes: Conjunctivae are normal.  Neck: Normal range of motion.  Cardiovascular: Normal rate, regular rhythm and normal heart sounds.   Pulmonary/Chest: Effort normal and breath sounds normal. No stridor. No respiratory distress. She has no wheezes. She has no rales.  Genitourinary:     Musculoskeletal: Normal range of motion.  Lymphadenopathy:       Right: No inguinal adenopathy present.       Left: No inguinal adenopathy present.  Neurological: She is alert and oriented to person, place, and time. She has normal strength.  Skin: Skin is warm and dry. She is not diaphoretic. No erythema.  Psychiatric: She has a normal mood and affect. Her behavior is normal.    ED Course  Procedures (including critical care time)  INCISION AND DRAINAGE Performed by: Trevor Maceobyn M Albert Consent: Verbal consent obtained. Risks and benefits: risks, benefits and alternatives were discussed Type: abscess  Body area: right labia majora  Anesthesia: local infiltration  Incision was made with a scalpel.  Local anesthetic: lidocaine 1% with epinephrine  Anesthetic total: 3 ml  Complexity: complex Blunt dissection to break up loculations  Drainage: purulent  Drainage amount: small  Packing material: none  Patient tolerance: Patient tolerated the  procedure well with no immediate complications.    DIAGNOSTIC STUDIES: Oxygen Saturation is 100% on RA, normal by my interpretation.     Labs Review Labs Reviewed - No data to display  Imaging Review No results found.   EKG Interpretation None      MDM   Final diagnoses:  Abscess of labia majora   Patient presenting with abscess to right labia majora. Afebrile and in no apparent distress. No adenopathy. No surrounding cellulitis. Abscess drained. This is on her labia majora, does not appear to be a Bartholin's cyst. Advised her to  followup with her primary care doctor or gyn. Stable for discharge. Return precautions given. Patient states understanding of treatment care plan and is agreeable.   I personally performed the services described in this documentation, which was scribed in my presence. The recorded information has been reviewed and is accurate.   Trevor MaceRobyn M Albert, PA-C 09/30/13 1526

## 2013-10-01 NOTE — ED Provider Notes (Signed)
History/physical exam/procedure(s) were performed by non-physician practitioner and as supervising physician I was immediately available for consultation/collaboration. I have reviewed all notes and am in agreement with care and plan.   Hilario Quarryanielle S Izzie Geers, MD 10/01/13 [phone]

## 2015-08-02 ENCOUNTER — Emergency Department (HOSPITAL_COMMUNITY)
Admission: EM | Admit: 2015-08-02 | Discharge: 2015-08-02 | Disposition: A | Discharge status: Home / Self Care | Payer: BC Managed Care – PPO | Attending: Emergency Medicine | Admitting: Emergency Medicine

## 2015-08-02 ENCOUNTER — Encounter (HOSPITAL_COMMUNITY): Payer: Self-pay | Admitting: Emergency Medicine

## 2015-08-02 ENCOUNTER — Emergency Department (HOSPITAL_COMMUNITY): Payer: BC Managed Care – PPO

## 2015-08-02 DIAGNOSIS — X58XXXA Exposure to other specified factors, initial encounter: Secondary | ICD-10-CM | POA: Insufficient documentation

## 2015-08-02 DIAGNOSIS — Y9355 Activity, bike riding: Secondary | ICD-10-CM | POA: Insufficient documentation

## 2015-08-02 DIAGNOSIS — Y9289 Other specified places as the place of occurrence of the external cause: Secondary | ICD-10-CM | POA: Insufficient documentation

## 2015-08-02 DIAGNOSIS — Z872 Personal history of diseases of the skin and subcutaneous tissue: Secondary | ICD-10-CM | POA: Diagnosis not present

## 2015-08-02 DIAGNOSIS — S8991XA Unspecified injury of right lower leg, initial encounter: Secondary | ICD-10-CM | POA: Diagnosis present

## 2015-08-02 DIAGNOSIS — Z880 Allergy status to penicillin: Secondary | ICD-10-CM | POA: Insufficient documentation

## 2015-08-02 DIAGNOSIS — F17200 Nicotine dependence, unspecified, uncomplicated: Secondary | ICD-10-CM | POA: Diagnosis not present

## 2015-08-02 DIAGNOSIS — Y998 Other external cause status: Secondary | ICD-10-CM | POA: Insufficient documentation

## 2015-08-02 DIAGNOSIS — M25561 Pain in right knee: Secondary | ICD-10-CM

## 2015-08-02 MED ORDER — TRAMADOL HCL 50 MG PO TABS: 50.0000 mg | ORAL_TABLET | Freq: Four times a day (QID) | ORAL | Status: DC | PRN
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
## 2015-08-02 NOTE — Discharge Instructions (Signed)
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
## 2016-08-23 ENCOUNTER — Emergency Department (HOSPITAL_COMMUNITY)
Admission: EM | Admit: 2016-08-23 | Discharge: 2016-08-23 | Disposition: A | Discharge status: Home / Self Care | Payer: BC Managed Care – PPO | Attending: Emergency Medicine | Admitting: Emergency Medicine

## 2016-08-23 ENCOUNTER — Encounter (HOSPITAL_COMMUNITY): Payer: Self-pay

## 2016-08-23 DIAGNOSIS — F17200 Nicotine dependence, unspecified, uncomplicated: Secondary | ICD-10-CM | POA: Insufficient documentation

## 2016-08-23 DIAGNOSIS — Z79899 Other long term (current) drug therapy: Secondary | ICD-10-CM | POA: Insufficient documentation

## 2016-08-23 DIAGNOSIS — N898 Other specified noninflammatory disorders of vagina: Secondary | ICD-10-CM | POA: Diagnosis present

## 2016-08-23 DIAGNOSIS — N764 Abscess of vulva: Secondary | ICD-10-CM | POA: Insufficient documentation

## 2016-08-23 MED ORDER — LIDOCAINE HCL (PF) 1 % IJ SOLN
5.0000 mL | Freq: Once | INTRAMUSCULAR | Status: AC
  Administered 2016-08-23: 5 mL via INTRADERMAL
  Filled 2016-08-23: qty 30

## 2016-08-23 MED ORDER — DOXYCYCLINE HYCLATE 100 MG PO CAPS: 100.0000 mg | ORAL_CAPSULE | Freq: Two times a day (BID) | ORAL | 0 refills | Status: DC

## 2016-08-23 NOTE — ED Provider Notes (Signed)
[facility]-EMERGENCY DEPT Provider Note   CSN: [id number] Arrival date & time: 08/23/16  1746   By signing my name below, I, Clarisse Gouge, attest that this documentation has been prepared under the direction and in the presence of [facility], PA-C. Electronically Signed: Clarisse Gouge, Scribe. 08/23/16. 6:39 PM.   History   Chief Complaint Chief Complaint  Patient presents with  . Ingrown Hair   The history is provided by the patient and medical records. No language interpreter was used.    HPI Comments: [name] [name] is a 31 y.o. female with Hx of pilonidal cyst who presents to the Emergency Department complaining of a raised bump to her pubic region x ~3 weeks. She states this morning the bump "burst", began draining and skin has grown over it. She notes Hx of same treated in [facility] ED by cutting the extra skin away. Pt denies fever, chills or N/V/D.  Denies known MRSA diagnosis.  Past Medical History:  Diagnosis Date  . Cyst near tailbone   . Pilonidal cyst     There are no active problems to display for this patient.   Past Surgical History:  Procedure Laterality Date  . WISDOM TOOTH EXTRACTION      OB History    No data available       Home Medications    Prior to Admission medications   Medication Sig Start Date End Date Taking? Authorizing Provider  cephALEXin (KEFLEX) 500 MG capsule Take 500 mg by mouth 4 (four) times daily. Day 5 of therapy. 10 day regimen 09/17/12   [name], MD  doxycycline (VIBRAMYCIN) 100 MG capsule Take 1 capsule (100 mg total) by mouth 2 (two) times daily. 08/23/16   Haylen Bellotti, PA-C  traMADol (ULTRAM) 50 MG tablet Take 1 tablet (50 mg total) by mouth every 6 (six) hours as needed. 08/02/15   [name], PA-C    Family History No family history on file.  Social History Social History  Substance Use Topics  . Smoking status: Current Every Day Smoker    Last attempt to quit: 09/15/2012  . Smokeless tobacco: Not on  file  . Alcohol use No     Allergies   Amoxicillin   Review of Systems Review of Systems  Skin:       +raised bump with drainage  All other systems reviewed and are negative.    Physical Exam Updated Vital Signs BP 139/82 (BP Location: Left Arm)   Pulse 88   Temp 98.1 F (36.7 C) (Oral)   Resp 18   Ht 5\' 4"  (1.626 m)   Wt 163 lb (73.9 kg)   LMP 08/02/2016 (Approximate)   SpO2 100%   BMI 27.98 kg/m   Physical Exam  Constitutional: She is oriented to person, place, and time. She appears well-developed and well-nourished. No distress.  HENT:  Head: Normocephalic and atraumatic.  Eyes: Conjunctivae are normal. No scleral icterus.  Neck: Normal range of motion.  Cardiovascular: Normal rate, regular rhythm and intact distal pulses.   Pulmonary/Chest: Effort normal and breath sounds normal.  Abdominal: Soft. She exhibits no distension. There is no tenderness.  Genitourinary:  Genitourinary Comments: Chaperone - Mardella Layman, RN present 2x2cm area of induration with granulation tissue at the center and draining purulent matter, minimal extending erythema  Lymphadenopathy:    She has no cervical adenopathy.  Neurological: She is alert and oriented to person, place, and time.  Skin: Skin is warm and dry. She is not diaphoretic. There is erythema.  Psychiatric: She has a normal mood and affect.  Nursing note and vitals reviewed.    ED Treatments / Results  DIAGNOSTIC STUDIES: Oxygen Saturation is 100% on RA, normal by my interpretation.    COORDINATION OF CARE: 6:39 PM Discussed treatment plan with pt at bedside and pt agreed to plan.  Labs (all labs ordered are listed, but only abnormal results are displayed) Labs Reviewed - No data to display  EKG  EKG Interpretation None       Radiology No results found.  Procedures .Marland Kitchen.Incision and Drainage Date/Time: 08/23/2016 6:35 PM Performed by: Dierdre ForthMUTHERSBAUGH, Musette Kisamore Authorized by: Dierdre ForthMUTHERSBAUGH, Zamiya Dillard   Consent:     Consent obtained:  Verbal   Consent given by:  Patient   Risks discussed:  Bleeding, incomplete drainage and infection   Alternatives discussed:  No treatment and alternative treatment (abx only) Location:    Type:  Abscess   Size:  2x2   Location:  Anogenital   Anogenital location:  Vulva Pre-procedure details:    Skin preparation:  Betadine Anesthesia (see MAR for exact dosages):    Anesthesia method:  Local infiltration   Local anesthetic:  Lidocaine 1% w/o epi (4mL) Procedure type:    Complexity:  Simple Procedure details:    Needle aspiration: no     Incision types:  Single straight   Scalpel blade:  11   Wound management:  Probed and deloculated and irrigated with saline   Drainage:  Bloody and purulent   Drainage amount:  Moderate   Wound treatment:  Wound left open   Packing materials:  None Post-procedure details:    Patient tolerance of procedure:  Tolerated well, no immediate complications   (including critical care time)  Medications Ordered in ED Medications  lidocaine (PF) (XYLOCAINE) 1 % injection 5 mL (not administered)     Initial Impression / Assessment and Plan / ED Course  I have reviewed the triage vital signs and the nursing notes.  Pertinent labs & imaging results that were available during my care of the patient were reviewed by me and considered in my medical decision making (see chart for details).     Patient with skin abscess amenable to incision and drainage.  Abscess was not large enough to warrant packing or drain,  wound recheck in 2 days. Encouraged home warm soaks and flushing.  Mild signs of cellulitis is surrounding skin.  Will d/c to home.  No antibiotic therapy given due to location.   Final Clinical Impressions(s) / ED Diagnoses   Final diagnoses:  Abscess, vulva    New Prescriptions New Prescriptions   DOXYCYCLINE (VIBRAMYCIN) 100 MG CAPSULE    Take 1 capsule (100 mg total) by mouth 2 (two) times daily.   I personally  performed the services described in this documentation, which was scribed in my presence. The recorded information has been reviewed and is accurate.    Dahlia ClientHannah Palyn Scrima, PA-C 08/23/16 1839    Rolland PorterMark James, MD 09/05/16 [phone]

## 2016-08-23 NOTE — ED Triage Notes (Signed)
Pt presents with c/o possible ingrown hair. Pt reports the hair is in her groin area and it started as a bump around the beginning of this month.

## 2016-08-23 NOTE — Discharge Instructions (Signed)
1. Medications: doxycycline, usual home medications 2. Treatment: rest, drink plenty of fluids, use warm compresses, flush abscess with warm water several times per day 3. Follow Up: Please followup with your primary doctor in 2-3 days for discussion of your diagnoses and further evaluation after today's visit; if you do not have a primary care doctor use the resource guide provided to find one; Please return to the ER for fevers, chills, nausea, vomiting or other signs of infection  
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
## 2017-07-15 ENCOUNTER — Other Ambulatory Visit: Payer: Self-pay

## 2017-07-15 ENCOUNTER — Encounter (HOSPITAL_COMMUNITY): Payer: Self-pay

## 2017-07-15 ENCOUNTER — Emergency Department (HOSPITAL_COMMUNITY)
Admission: EM | Admit: 2017-07-15 | Discharge: 2017-07-15 | Disposition: A | Discharge status: Home / Self Care | Payer: BC Managed Care – PPO | Attending: Emergency Medicine | Admitting: Emergency Medicine

## 2017-07-15 DIAGNOSIS — L0501 Pilonidal cyst with abscess: Secondary | ICD-10-CM | POA: Diagnosis not present

## 2017-07-15 DIAGNOSIS — F17210 Nicotine dependence, cigarettes, uncomplicated: Secondary | ICD-10-CM | POA: Diagnosis not present

## 2017-07-15 MED ORDER — LIDOCAINE-EPINEPHRINE (PF) 2 %-1:200000 IJ SOLN
20.0000 mL | Freq: Once | INTRAMUSCULAR | Status: AC
  Administered 2017-07-15: 20 mL via INTRADERMAL
  Filled 2017-07-15: qty 20

## 2017-07-15 MED ORDER — ACETAMINOPHEN 500 MG PO TABS
1000.0000 mg | ORAL_TABLET | Freq: Once | ORAL | Status: AC
  Administered 2017-07-15: 1000 mg via ORAL
  Filled 2017-07-15: qty 2

## 2017-07-15 NOTE — ED Notes (Signed)
ED Provider at bedside. 

## 2017-07-15 NOTE — ED Triage Notes (Signed)
Patient c/o cyst at the coccyx area x 3 days. Patient reports tht she has had before approx 4 years ago.

## 2017-07-15 NOTE — ED Provider Notes (Signed)
[facility]-EMERGENCY DEPT Provider Note  CSN: [id number] Arrival date & time: 07/15/17 0801  Chief Complaint(s) Cyst  HPI [name] [name] is a 32 y.o. female   The history is provided by the patient.  Abscess  Location:  Pelvis Ano-genital abscess location: tailbone. Abscess quality: painful, redness and warmth   Abscess quality: not draining   Duration:  3 days Progression:  Worsening Pain details:    Quality:  Pressure, throbbing and sharp   Severity:  Severe   Timing:  Constant   Progression:  Worsening Chronicity:  Recurrent Context: not diabetes, not immunosuppression, not injected drug use and not skin injury   Relieved by:  Nothing Worsened by:  Warm compresses (palpations) Associated symptoms: no fever, no nausea and no vomiting   Risk factors: prior abscess   Risk factors: no family hx of MRSA and no hx of MRSA     Past Medical History Past Medical History:  Diagnosis Date  . Cyst near tailbone   . Pilonidal cyst    There are no active problems to display for this patient.  Home Medication(s) Prior to Admission medications   Medication Sig Start Date End Date Taking? Authorizing Provider  Phenyleph-Doxylamine-DM-APAP (NYQUIL SEVERE COLD/FLU) 5-6.25-10-325 MG CAPS Take 2 capsules by mouth at bedtime as needed (cold, cough).    Yes [provider]                                                                                                                                    Past Surgical History Past Surgical History:  Procedure Laterality Date  . WISDOM TOOTH EXTRACTION     Family History Family History  Problem Relation Age of Onset  . Heart failure Mother   . Lupus Mother   . Heart failure Father     Social History Social History   Tobacco Use  . Smoking status: Current Every Day Smoker    Packs/day: 0.45    Last attempt to quit: 09/15/2012    Years since quitting: 4.8  . Smokeless tobacco: Never Used    Substance Use Topics  . Alcohol use: No  . Drug use: No    Comment: former user   Allergies Amoxicillin  Review of Systems Review of Systems  Constitutional: Negative for fever.  Gastrointestinal: Negative for nausea and vomiting.   All other systems are reviewed and are negative for acute change except as noted in the HPI  Physical Exam Vital Signs  I have reviewed the triage vital signs BP 129/88   Pulse (!) 119   Temp 98.9 F (37.2 C)   Resp 18   Ht 5\' 4"  (1.626 m)   Wt 76.2 kg (168 lb)   LMP 07/03/2017   SpO2 99%   BMI 28.84 kg/m   Physical Exam  Constitutional: She is oriented to person, place, and time. She appears well-developed and well-nourished. No distress.  HENT:  Head: Normocephalic and atraumatic.  Right Ear: External ear normal.  Left Ear: External ear normal.  Nose: Nose normal.  Eyes: Conjunctivae and EOM are normal. No scleral icterus.  Neck: Normal range of motion and phonation normal.  Cardiovascular: Normal rate and regular rhythm.  Pulmonary/Chest: Effort normal. No stridor. No respiratory distress.  Abdominal: She exhibits no distension.  Musculoskeletal: Normal range of motion. She exhibits no edema.       Lumbar back: She exhibits tenderness.       Back:  Chaperone present during exam.  Neurological: She is alert and oriented to person, place, and time.  Skin: She is not diaphoretic.  Psychiatric: She has a normal mood and affect. Her behavior is normal.  Vitals reviewed.   ED Results and Treatments Labs (all labs ordered are listed, but only abnormal results are displayed) Labs Reviewed - No data to display                                                                                                                       EKG  EKG Interpretation  Date/Time:    Ventricular Rate:    PR Interval:    QRS Duration:   QT Interval:    QTC Calculation:   R Axis:     Text Interpretation:        Radiology No results  found. Pertinent labs & imaging results that were available during my care of the patient were reviewed by me and considered in my medical decision making (see chart for details).  Medications Ordered in ED Medications  lidocaine-EPINEPHrine (XYLOCAINE W/EPI) 2 %-1:200000 (PF) injection 20 mL (20 mLs Intradermal Given by Other 07/15/17 0837)  acetaminophen (TYLENOL) tablet 1,000 mg (1,000 mg Oral Given 07/15/17 0837)                                                                                                                                    Procedures .Marland KitchenIncision and Drainage Date/Time: 07/15/2017 9:15 AM Performed by: Nira Conn, MD Authorized by: Nira Conn, MD   Consent:    Consent obtained:  Verbal   Consent given by:  Patient   Risks discussed:  Incomplete drainage, infection, bleeding and damage to other organs Location:    Type:  Pilonidal cyst   Size:  8x4 cm   Location:  Anogenital   Anogenital location:  Pilonidal Pre-procedure details:  Skin preparation:  Betadine Anesthesia (see MAR for exact dosages):    Anesthesia method:  Local infiltration   Local anesthetic:  Lidocaine 2% WITH epi Procedure type:    Complexity:  Complex Procedure details:    Incision types:  Single straight   Incision depth:  Subcutaneous   Scalpel blade:  11   Wound management:  Probed and deloculated and extensive cleaning   Drainage:  Purulent   Drainage amount:  Copious   Wound treatment:  Wound left open   Packing materials:  1/2 in iodoform gauze Post-procedure details:    Patient tolerance of procedure:  Tolerated well, no immediate complications   EMERGENCY DEPARTMENT US SOFT TISSUE INTERPRETATION "Study: Limited Soft Tissue Ultrasound"  INDICATIONS: Soft tissue infection Multiple views of the body part were obtained in real-time with a multi-frequency linear probe  PERFORMED BY: Myself IMAGES ARCHIVED?: Yes SIDE:Midline BODY  PART:perineum INTERPRETATION:  Abcess present    (including critical care time)  Medical Decision Making / ED Course I have reviewed the nursing notes for this encounter and the patient's prior records (if available in EHR or on provided paperwork).    Pilonidal abscess.  No overlying erythema or evidence of cellulitis.  Incision and drain as above with packing.  Recommended close follow-up in 2-3 days for wound check and repacking.  Recommend surgery follow-up given the recurrence.  No abx needed at this time.  The patient appears reasonably screened and/or stabilized for discharge and I doubt any other medical condition or other [facility] requiring further screening, evaluation, or treatment in the ED at this time prior to discharge.  The patient is safe for discharge with strict return precautions.   Final Clinical Impression(s) / ED Diagnoses Final diagnoses:  Pilonidal abscess    Disposition: Discharge  Condition: Good  I have discussed the results, Dx and Tx plan with the patient who expressed understanding and agree(s) with the plan. Discharge instructions discussed at great length. The patient was given strict return precautions who verbalized understanding of the instructions. No further questions at time of discharge.    ED Discharge Orders    None       Follow Up: Luretha Murphy, MD [address] [place] [address] [phone]  Schedule an appointment as soon as possible for a visit  For close follow up to assess for pilondal abscess  [facility]ENCY DEPT [address] [id number] mc [place] [address] [phone]  in2-3 days for wound check and repacking     This chart was dictated using voice recognition software.  Despite best efforts to proofread,  errors can occur which can change the documentation meaning.   Nira Conn, MD 07/15/17 [phone]

## 2017-09-04 ENCOUNTER — Other Ambulatory Visit: Payer: Self-pay

## 2017-09-04 ENCOUNTER — Encounter (HOSPITAL_COMMUNITY): Payer: Self-pay

## 2017-09-04 ENCOUNTER — Emergency Department (HOSPITAL_COMMUNITY)
Admission: EM | Admit: 2017-09-04 | Discharge: 2017-09-04 | Disposition: A | Discharge status: Home / Self Care | Payer: BC Managed Care – PPO | Attending: Emergency Medicine | Admitting: Emergency Medicine

## 2017-09-04 DIAGNOSIS — H1032 Unspecified acute conjunctivitis, left eye: Secondary | ICD-10-CM | POA: Insufficient documentation

## 2017-09-04 DIAGNOSIS — F17210 Nicotine dependence, cigarettes, uncomplicated: Secondary | ICD-10-CM | POA: Insufficient documentation

## 2017-09-04 DIAGNOSIS — H5712 Ocular pain, left eye: Secondary | ICD-10-CM | POA: Diagnosis present

## 2017-09-04 MED ORDER — FLUORESCEIN SODIUM 1 MG OP STRP
1.0000 | ORAL_STRIP | Freq: Once | OPHTHALMIC | Status: AC
  Administered 2017-09-04: 1 via OPHTHALMIC
  Filled 2017-09-04: qty 1

## 2017-09-04 MED ORDER — TETRACAINE HCL 0.5 % OP SOLN
2.0000 [drp] | Freq: Once | OPHTHALMIC | Status: AC
  Administered 2017-09-04: 2 [drp] via OPHTHALMIC
  Filled 2017-09-04: qty 4

## 2017-09-04 MED ORDER — TOBRAMYCIN 0.3 % OP SOLN: 1.0000 [drp] | OPHTHALMIC | 0 refills | Status: AC

## 2017-09-04 NOTE — Discharge Instructions (Signed)
° °  Contact a health care provider if: °You have a fever. °Your symptoms do not get better after 10 days. °Get help right away if: °You have a fever and your symptoms suddenly get worse. °You have severe pain when you move your eye. °You have facial pain, redness, or swelling. °You have sudden loss of vision. °

## 2017-09-04 NOTE — ED Triage Notes (Signed)
Patient c/o left eye pain and drainage x 2 days.

## 2017-09-04 NOTE — ED Provider Notes (Signed)
[facility]-EMERGENCY DEPT Provider Note   CSN: [id number] Arrival date & time: 09/04/17  4098     History   Chief Complaint Chief Complaint  Patient presents with  . Eye Pain    HPI [name] [name] is a 32 y.o. female. Onset 1 day with injected conjunctiva and FB sensation. No injury. Awoke with lash mattering. No right eye problems. Occasionally wears color contacts. No changes in vision. +Tearing.  HPI  Past Medical History:  Diagnosis Date  . Cyst near tailbone   . Pilonidal cyst     There are no active problems to display for this patient.   Past Surgical History:  Procedure Laterality Date  . WISDOM TOOTH EXTRACTION       OB History   None      Home Medications    Prior to Admission medications   Medication Sig Start Date End Date Taking? Authorizing Provider  tetrahydrozoline 0.05 % ophthalmic solution Place 1 drop into both eyes 2 (two) times daily as needed (eye irritation).   Yes [provider]    Family History Family History  Problem Relation Age of Onset  . Heart failure Mother   . Lupus Mother   . Heart failure Father     Social History Social History   Tobacco Use  . Smoking status: Current Every Day Smoker    Packs/day: 0.45    Types: Cigarettes    Last attempt to quit: 09/15/2012    Years since quitting: 4.9  . Smokeless tobacco: Never Used  Substance Use Topics  . Alcohol use: No  . Drug use: Not Currently    Types: Marijuana    Comment: former user     Allergies   Amoxicillin   Review of Systems Review of Systems  Ten systems reviewed and are negative for acute change, except as noted in the HPI.   Physical Exam Updated Vital Signs BP 130/86 (BP Location: Left Arm)   Pulse 98   Temp 98.3 F (36.8 C) (Oral)   Resp 16   Ht 5\' 4"  (1.626 m)   Wt 78.2 kg (172 lb 6 oz)   LMP 08/17/2017   SpO2 100%   BMI 29.59 kg/m   Physical Exam  Constitutional: She is oriented to person, place,  and time. She appears well-developed and well-nourished. No distress.  HENT:  Head: Normocephalic and atraumatic.  Eyes: Pupils are equal, round, and reactive to light. EOM are normal. No scleral icterus.  left eye with findings of typical conjunctivitis noted; erythema and discharge. PERRLA, no foreign body noted. No periorbital cellulitis. The corneas are clear and fundi normal. Visual acuity normal.  No fluorescein uptake  Neck: Normal range of motion.  Cardiovascular: Normal rate, regular rhythm and normal heart sounds. Exam reveals no gallop and no friction rub.  No murmur heard. Pulmonary/Chest: Effort normal and breath sounds normal. No respiratory distress.  Abdominal: Soft. Bowel sounds are normal. She exhibits no distension and no mass. There is no tenderness. There is no guarding.  Neurological: She is alert and oriented to person, place, and time.  Skin: Skin is warm and dry. She is not diaphoretic.  Psychiatric: Her behavior is normal.  Nursing note and vitals reviewed.    ED Treatments / Results  Labs (all labs ordered are listed, but only abnormal results are displayed) Labs Reviewed - No data to display  EKG None  Radiology No results found.  Procedures Procedures (including critical care time)  Medications Ordered  in ED Medications  fluorescein ophthalmic strip 1 strip (has no administration in time range)  tetracaine (PONTOCAINE) 0.5 % ophthalmic solution 2 drop (has no administration in time range)     Initial Impression / Assessment and Plan / ED Course  I have reviewed the triage vital signs and the nursing notes.  Pertinent labs & imaging results that were available during my care of the patient were reviewed by me and considered in my medical decision making (see chart for details).        Conjunctivitis - probably bacterial. No evidence of corneal ulcer/ abrasion/ AACG/ Uveitis/ Herpes keratitis. Antibiotic drops per order. Hygiene discussed.  Follow up with ophthalmology as discussed .  Final Clinical Impressions(s) / ED Diagnoses   Final diagnoses:  Acute conjunctivitis of left eye, unspecified acute conjunctivitis type    ED Discharge Orders        Ordered    tobramycin (TOBREX) 0.3 % ophthalmic solution  Every 4 hours     09/04/17 0932       Arthor CaptainHarris, Jeraldean Wechter, PA-C 09/04/17 [phone]    Raeford RazorKohut, Stephen, MD 09/05/17 [phone]

## 2019-08-26 ENCOUNTER — Encounter: Payer: Self-pay | Admitting: Obstetrics and Gynecology

## 2019-08-26 ENCOUNTER — Ambulatory Visit (INDEPENDENT_AMBULATORY_CARE_PROVIDER_SITE_OTHER): Payer: BC Managed Care – PPO | Admitting: Obstetrics and Gynecology

## 2019-08-26 ENCOUNTER — Other Ambulatory Visit: Payer: Self-pay

## 2019-08-26 VITALS — BP 128/80 | Ht 64.5 in | Wt 151.2 lb

## 2019-08-26 DIAGNOSIS — N898 Other specified noninflammatory disorders of vagina: Secondary | ICD-10-CM

## 2019-08-26 DIAGNOSIS — Z13220 Encounter for screening for lipoid disorders: Secondary | ICD-10-CM

## 2019-08-26 DIAGNOSIS — Z01419 Encounter for gynecological examination (general) (routine) without abnormal findings: Secondary | ICD-10-CM

## 2019-08-26 DIAGNOSIS — Z124 Encounter for screening for malignant neoplasm of cervix: Secondary | ICD-10-CM | POA: Diagnosis not present

## 2019-08-26 DIAGNOSIS — B9689 Other specified bacterial agents as the cause of diseases classified elsewhere: Secondary | ICD-10-CM

## 2019-08-26 DIAGNOSIS — N760 Acute vaginitis: Secondary | ICD-10-CM

## 2019-08-26 LAB — WET PREP FOR TRICH, YEAST, CLUE

## 2019-08-26 MED ORDER — METRONIDAZOLE 500 MG PO TABS: 500.0000 mg | ORAL_TABLET | Freq: Two times a day (BID) | ORAL | 0 refills | Status: AC

## 2019-08-26 NOTE — Progress Notes (Signed)
   [name] [name] 12/27/1985 [id number]  SUBJECTIVE:  34 y.o. G0 female for annual routine gynecologic exam and Pap smear. She has had some increased white discharge, but no odor or itching.  She indicates her periods are regular and not heavy.   Current Outpatient Medications  Medication Sig Dispense Refill  . tetrahydrozoline 0.05 % ophthalmic solution Place 1 drop into both eyes 2 (two) times daily as needed (eye irritation).    Marland Kitchen tobramycin (TOBREX) 0.3 % ophthalmic solution Place 1 drop into both eyes every 4 (four) hours. For 5-7 days 5 mL 0   No current facility-administered medications for this visit.   Allergies: Amoxicillin  Patient's last menstrual period was 08/07/2019 (within days).  Past medical history,surgical history, problem list, medications, allergies, family history and social history were all reviewed and documented as reviewed in the EPIC chart.  ROS:  Feeling well. No dyspnea or chest pain on exertion.  No abdominal pain, change in bowel habits, black or bloody stools.  No urinary tract symptoms. GYN ROS: normal menses, no abnormal bleeding, pelvic pain or discharge, no breast pain or new or enlarging lumps on self exam. No neurological complaints.   OBJECTIVE:  Ht 5' 4.5" (1.638 m)   Wt 151 lb 3.2 oz (68.6 kg)   LMP 08/07/2019 (Within Days)   BMI 25.55 kg/m  The patient appears well, alert, oriented x 3, in no distress. ENT normal.  Neck supple. No cervical or supraclavicular adenopathy or thyromegaly.  Lungs are clear, good air entry, no wheezes, rhonchi or rales. S1 and S2 normal, no murmurs, regular rate and rhythm.  Abdomen soft without tenderness, guarding, mass or organomegaly.  Neurological is normal, no focal findings.  BREAST EXAM: breasts appear normal, no suspicious masses, no skin or nipple changes or axillary nodes  PELVIC EXAM: VULVA: normal appearing vulva with no masses, tenderness or lesions, VAGINA: normal appearing vagina with normal color  and discharge, no lesions, CERVIX: normal appearing cervix without discharge or lesions, UTERUS: uterus is normal size, shape, consistency and nontender, ADNEXA: normal adnexa in size, nontender and no masses, RECTAL: normal rectal, no masses, PAP: Pap smear done today, thin-prep method, WET MOUNT done - results: +clue cells, no hyphae or Trichomonas, no WBC, many bacteria, 7-12 epithelial cells/hpf  Chaperone: Vena Austria CMA present during the examination  ASSESSMENT:  34 y.o. No obstetric history on file. here for annual gynecologic exam  PLAN:   1. No menstrual or hormonal concerns.  2. Pap smear collected today.  Current screening guidelines calling for the 3-year Pap smear cytology only interval. 3. Contraception. Condoms for now. Encouraged prenatal vitamin if trying to conceive, or if not on a highly effective contraceptive method. 4. Vaginal discharge.  Bacterial vaginosis discussed.  Recommend treatment with metronidazole 500 mg twice daily for 7 days, side effects discussed, she will avoid alcohol use while using the medication. 5. Normal breast exam.  Encouraged breast self awareness and notify us of any concerning changes. 6. Health maintenance.  Routine screening blood work (lipids, CBC, CMP) is ordered.  Return annually or sooner, prn.  Theresia Majors MD, FACOG  08/26/19

## 2019-08-27 LAB — LIPID PANEL
Cholesterol: 180 mg/dL (ref <200)
HDL: 47 mg/dL — ABNORMAL LOW (ref >=50)
LDL Cholesterol (Calc): 116 mg/dL (calc) — ABNORMAL HIGH
Non-HDL Cholesterol (Calc): 133 mg/dL (calc) — ABNORMAL HIGH (ref <130)
Total CHOL/HDL Ratio: 3.8 (calc) (ref <5.0)
Triglycerides: 78 mg/dL (ref <150)

## 2019-08-27 LAB — COMPREHENSIVE METABOLIC PANEL
AG Ratio: 1.4 (calc) (ref 1.0–2.5)
ALT: 11 U/L (ref 6–29)
AST: 13 U/L (ref 10–30)
Albumin: 4.5 g/dL (ref 3.6–5.1)
Alkaline phosphatase (APISO): 56 U/L (ref 31–125)
BUN: 13 mg/dL (ref 7–25)
CO2: 25 mmol/L (ref 20–32)
Calcium: 9.4 mg/dL (ref 8.6–10.2)
Chloride: 105 mmol/L (ref 98–110)
Creat: 0.76 mg/dL (ref 0.50–1.10)
Globulin: 3.3 g/dL (calc) (ref 1.9–3.7)
Glucose, Bld: 82 mg/dL (ref 65–99)
Potassium: 4.2 mmol/L (ref 3.5–5.3)
Sodium: 137 mmol/L (ref 135–146)
Total Bilirubin: 0.4 mg/dL (ref 0.2–1.2)
Total Protein: 7.8 g/dL (ref 6.1–8.1)

## 2019-08-27 LAB — CBC
HCT: 42.2 % (ref 35.0–45.0)
Hemoglobin: 13.9 g/dL (ref 11.7–15.5)
MCH: 30 pg (ref 27.0–33.0)
MCHC: 32.9 g/dL (ref 32.0–36.0)
MCV: 91.1 fL (ref 80.0–100.0)
MPV: 10.4 fL (ref 7.5–12.5)
Platelets: 269 10*3/uL (ref 140–400)
RBC: 4.63 10*6/uL (ref 3.80–5.10)
RDW: 13.6 % (ref 11.0–15.0)
WBC: 9.3 10*3/uL (ref 3.8–10.8)

## 2019-08-28 LAB — PAP, TP IMAGING W/ HPV RNA, RFLX HPV TYPE 16,18/45: HPV DNA High Risk: NOT DETECTED

## 2019-09-02 NOTE — Progress Notes (Signed)
Please let the patient know that her Pap smear is normal, HPV negative.  Her blood counts and metabolic panel and blood sugar level are all normal.  Her total cholesterol level and triglycerides are normal.  Her good cholesterol level/HDL was slightly low (want this number to be higher) and bad cholesterol level/LDL was a bit elevated (want this number to be lower).  Recommend healthy diet, low saturated fats and cholesterol, regular exercise 30 minutes a day for at least 5 days a week which can help improve those numbers.  Recheck in 1 year.

## 2020-08-26 ENCOUNTER — Encounter: Payer: BC Managed Care – PPO | Admitting: Obstetrics and Gynecology

## 2020-11-24 ENCOUNTER — Encounter (HOSPITAL_COMMUNITY): Payer: Self-pay | Admitting: *Deleted

## 2020-11-24 ENCOUNTER — Emergency Department (HOSPITAL_COMMUNITY)
Admission: EM | Admit: 2020-11-24 | Discharge: 2020-11-24 | Disposition: A | Discharge status: Home / Self Care | Payer: BC Managed Care – PPO | Attending: Emergency Medicine | Admitting: Emergency Medicine

## 2020-11-24 ENCOUNTER — Other Ambulatory Visit: Payer: Self-pay

## 2020-11-24 DIAGNOSIS — F17210 Nicotine dependence, cigarettes, uncomplicated: Secondary | ICD-10-CM | POA: Insufficient documentation

## 2020-11-24 DIAGNOSIS — H538 Other visual disturbances: Secondary | ICD-10-CM | POA: Diagnosis present

## 2020-11-24 DIAGNOSIS — H1033 Unspecified acute conjunctivitis, bilateral: Secondary | ICD-10-CM | POA: Diagnosis not present

## 2020-11-24 DIAGNOSIS — H1032 Unspecified acute conjunctivitis, left eye: Secondary | ICD-10-CM

## 2020-11-24 MED ORDER — ERYTHROMYCIN 5 MG/GM OP OINT: TOPICAL_OINTMENT | OPHTHALMIC | 0 refills | Status: AC

## 2020-11-24 MED ORDER — FLUORESCEIN SODIUM 1 MG OP STRP
1.0000 | ORAL_STRIP | Freq: Once | OPHTHALMIC | Status: AC
  Administered 2020-11-24: 1 via OPHTHALMIC
  Filled 2020-11-24: qty 1

## 2020-11-24 MED ORDER — TETRACAINE HCL 0.5 % OP SOLN
2.0000 [drp] | Freq: Once | OPHTHALMIC | Status: AC
  Administered 2020-11-24: 2 [drp] via OPHTHALMIC
  Filled 2020-11-24: qty 4

## 2020-11-24 NOTE — ED Provider Notes (Signed)
[facility]-EMERGENCY DEPT Provider Note   CSN: [id number] Arrival date & time: 11/24/20  0941     History Chief Complaint  Patient presents with   Eye Problem    [name] [name] is a 35 y.o. female.  HPI 35 year old female presenting with left eye redness, tearing, and sensation of foreign body x 5 days. Denies injury or trauma. Associated with irritation and hazy/clouding in the left eye. No loss of vision, purulent discharge or crusting. No itching or pain with eye movements. No HA, dizziness, or fever/chills. Reports sensation of foreign body has resolved as of today and clouding has improved. Still having redness and tearing. She's had similar episodes in the past but not to this extent. No contact use. PEx: PERRL. EOM intact without pain. Injected sclera and edema of the conjunctiva on the left.Tearing is present. No purulent discharge.        Past Medical History:  Diagnosis Date   Cyst near tailbone    Pilonidal cyst     There are no problems to display for this patient.   Past Surgical History:  Procedure Laterality Date   WISDOM TOOTH EXTRACTION       OB History     Gravida  0   Para  0   Term  0   Preterm  0   AB  0   Living  0      SAB  0   IAB  0   Ectopic  0   Multiple  0   Live Births  0           Family History  Problem Relation Age of Onset   Heart failure Mother    Lupus Mother    Heart failure Father     Social History   Tobacco Use   Smoking status: Every Day    Packs/day: 0.45    Pack years: 0.00    Types: Cigarettes    Last attempt to quit: 09/15/2012    Years since quitting: 8.1   Smokeless tobacco: Never  Vaping Use   Vaping Use: Never used  Substance Use Topics   Alcohol use: Yes    Comment: rerely   Drug use: Not Currently    Types: Marijuana    Comment: former user    Home Medications Prior to Admission medications   Medication Sig Start Date End Date Taking? Authorizing  Provider  erythromycin ophthalmic ointment Place a 1/2 inch ribbon of ointment into the lower eyelid. Every 6 hours for 7 days 11/24/20  Yes Lillien Petronio, [place] S, PA  Acetaminophen (MIDOL PO) Take by mouth. As needed    [provider]  metroNIDAZOLE (FLAGYL) 500 MG tablet Take 1 tablet (500 mg total) by mouth 2 (two) times daily. 08/26/19   Theresia Majors, MD  tetrahydrozoline 0.05 % ophthalmic solution Place 1 drop into both eyes 2 (two) times daily as needed (eye irritation).    [provider]  tobramycin (TOBREX) 0.3 % ophthalmic solution Place 1 drop into both eyes every 4 (four) hours. For 5-7 days 09/04/17   Arthor Captain, PA-C    Allergies    Amoxicillin  Review of Systems   Review of Systems  Constitutional:  Negative for fever.  HENT:  Negative for congestion.   Eyes:  Positive for pain and redness.  Respiratory:  Negative for shortness of breath.   Cardiovascular:  Negative for chest pain.  Gastrointestinal:  Negative for abdominal distention.  Neurological:  Negative for  dizziness and headaches.   Physical Exam Updated Vital Signs BP (!) 147/92 (BP Location: Left Arm)   Pulse 60   Temp 98.6 F (37 C) (Oral)   Resp 16   SpO2 97%   Physical Exam Vitals and nursing note reviewed.  Constitutional:      General: She is not in acute distress.    Appearance: Normal appearance. She is not ill-appearing.  HENT:     Head: Normocephalic and atraumatic.     Mouth/Throat:     Mouth: Mucous membranes are moist.  Eyes:     General: No scleral icterus.       Right eye: No discharge.        Left eye: Discharge present.    Conjunctiva/sclera: Conjunctivae normal.     Comments: Fluorescein exam without reuptake of dye  Pulmonary:     Effort: Pulmonary effort is normal.     Breath sounds: No stridor.  Neurological:     Mental Status: She is alert and oriented to person, place, and time. Mental status is at baseline.    ED Results / Procedures / Treatments    Labs (all labs ordered are listed, but only abnormal results are displayed) Labs Reviewed - No data to display  EKG None  Radiology No results found.  Procedures Procedures   Medications Ordered in ED Medications  fluorescein ophthalmic strip 1 strip (1 strip Left Eye Given 11/24/20 1454)  tetracaine (PONTOCAINE) 0.5 % ophthalmic solution 2 drop (2 drops Left Eye Given 11/24/20 1454)    ED Course  I have reviewed the triage vital signs and the nursing notes.  Pertinent labs & imaging results that were available during my care of the patient were reviewed by me and considered in my medical decision making (see chart for details).    MDM Rules/Calculators/A&P                          Patient with conjunctivitis of the left eye likely viral but will treat for bacterial.  Fluorescein exam is unremarkable.  Final Clinical Impression(s) / ED Diagnoses Final diagnoses:  Acute bacterial conjunctivitis of left eye    Rx / DC Orders ED Discharge Orders          Ordered    erythromycin ophthalmic ointment        11/24/20 1459             Gailen Shelter, [place] 11/24/20 1539    Koleen Distance, MD 11/24/20 1616

## 2020-11-24 NOTE — ED Triage Notes (Signed)
Pt complains of left eye redness and swelling x 3 days.

## 2020-11-24 NOTE — Discharge Instructions (Addendum)
You are being treated today for bacterial conjunctivitis as we discussed it is very likely this is a viral or allergic conjunctivitis however to be safe I am starting you on erythromycin ointment.  Please stay for the entire course.  You will be taking this every 6 hours for 7 days.  Please follow-up with ophthalmologist or ophthalmologist.  Also follow-up with primary care provider.

## 2023-06-13 ENCOUNTER — Other Ambulatory Visit: Payer: Self-pay

## 2023-06-13 ENCOUNTER — Encounter (HOSPITAL_COMMUNITY): Payer: Self-pay

## 2023-06-13 ENCOUNTER — Emergency Department (HOSPITAL_COMMUNITY)
Admission: EM | Admit: 2023-06-13 | Discharge: 2023-06-13 | Disposition: A | Discharge status: Home / Self Care | Payer: 59

## 2023-06-13 DIAGNOSIS — N390 Urinary tract infection, site not specified: Secondary | ICD-10-CM | POA: Diagnosis not present

## 2023-06-13 DIAGNOSIS — R339 Retention of urine, unspecified: Secondary | ICD-10-CM | POA: Diagnosis present

## 2023-06-13 LAB — URINALYSIS, ROUTINE W REFLEX MICROSCOPIC
Bacteria, UA: NONE SEEN
Bilirubin Urine: NEGATIVE
Glucose, UA: NEGATIVE mg/dL
Ketones, ur: NEGATIVE mg/dL
Nitrite: POSITIVE — AB
Protein, ur: 30 mg/dL — AB
Specific Gravity, Urine: 1.024 (ref 1.005–1.030)
WBC, UA: >50 WBC/hpf (ref 0–5)
pH: 5 (ref 5.0–8.0)

## 2023-06-13 LAB — PREGNANCY, URINE: Preg Test, Ur: NEGATIVE

## 2023-06-13 MED ORDER — CEPHALEXIN 500 MG PO CAPS: 500.0000 mg | ORAL_CAPSULE | Freq: Two times a day (BID) | ORAL | 0 refills | Status: AC

## 2023-06-13 NOTE — ED Triage Notes (Addendum)
 Patient stated she has been urinating more frequently that began on Monday. Blood in urine. Denies burning urination. Denies abdominal pain. Denies vaginal discharge.

## 2023-06-13 NOTE — ED Provider Notes (Signed)
 [place] EMERGENCY DEPARTMENT AT [facility] Provider Note   CSN: [id number] Arrival date & time: 06/13/23  9083     History  Chief Complaint  Patient presents with   Urinary Frequency    [name] [name] is a 38 y.o. female.  38 year old female presenting emergency department for dysuria x 4 days.  Reports frequency as well.  No fevers chills abdominal pain nausea or vomiting.  No vaginal discharge or pain.   Urinary Frequency       Home Medications Prior to Admission medications   Medication Sig Start Date End Date Taking? Authorizing Provider  Acetaminophen  (MIDOL PO) Take by mouth. As needed    [provider]  erythromycin  ophthalmic ointment Place a 1/2 inch ribbon of ointment into the lower eyelid. Every 6 hours for 7 days 11/24/20   Neldon Hamp RAMAN, PA  metroNIDAZOLE  (FLAGYL ) 500 MG tablet Take 1 tablet (500 mg total) by mouth 2 (two) times daily. 08/26/19   Arnaldo Purchase, MD  tetrahydrozoline 0.05 % ophthalmic solution Place 1 drop into both eyes 2 (two) times daily as needed (eye irritation).    [provider]  tobramycin  (TOBREX ) 0.3 % ophthalmic solution Place 1 drop into both eyes every 4 (four) hours. For 5-7 days 09/04/17   Harris, Abigail, PA-C      Allergies    Amoxicillin    Review of Systems   Review of Systems  Genitourinary:  Positive for frequency.    Physical Exam Updated Vital Signs BP (!) 155/98   Pulse 99   Temp 98.3 F (36.8 C) (Oral)   Resp 18   Ht 5' 6 (1.676 m)   Wt 76.2 kg   SpO2 100%   BMI 27.12 kg/m  Physical Exam Vitals and nursing note reviewed.  Constitutional:      General: She is not in acute distress.    Appearance: She is not toxic-appearing.  HENT:     Nose: Nose normal.     Mouth/Throat:     Mouth: Mucous membranes are moist.  Cardiovascular:     Rate and Rhythm: Normal rate and regular rhythm.  Pulmonary:     Effort: Pulmonary effort is normal.  Abdominal:     General: Abdomen  is flat. There is no distension.     Tenderness: There is no abdominal tenderness. There is no guarding or rebound.  Musculoskeletal:        General: Normal range of motion.  Skin:    General: Skin is warm.  Neurological:     Mental Status: She is alert and oriented to person, place, and time.  Psychiatric:        Behavior: Behavior normal.     ED Results / Procedures / Treatments   Labs (all labs ordered are listed, but only abnormal results are displayed) Labs Reviewed  URINALYSIS, ROUTINE W REFLEX MICROSCOPIC - Abnormal; Notable for the following components:      Result Value   APPearance HAZY (*)    Hgb urine dipstick LARGE (*)    Protein, ur 30 (*)    Nitrite POSITIVE (*)    Leukocytes,Ua MODERATE (*)    All other components within normal limits  PREGNANCY, URINE    EKG None  Radiology No results found.  Procedures Procedures    Medications Ordered in ED Medications - No data to display  ED Course/ Medical Decision Making/ A&P  Medical Decision Making This is a well-appearing 38 year old female presenting emergency department with symptoms of urinary tract infection.  Afebrile vital signs reassuring.  Physical exam is soft benign abdomen.  Does have history of labial abscess, per chart review but denies any vaginal pain or lesions.  No vaginal discharge.  Labs reviewed pregnancy test negative.  Appears to have urinary tract infection.  Will discharge with antibiotics.  Amount and/or Complexity of Data Reviewed Labs: ordered.    Details: Considered labs, however patient with no abdominal pain nausea vomiting.  Low suspicion for systemic infection.  She is making urine not having flank pain; low suspicion for obstructive pathology.          Final Clinical Impression(s) / ED Diagnoses Final diagnoses:  None    Rx / DC Orders ED Discharge Orders     None         Neysa Caron PARAS, DO 06/13/23 1123
